# Patient Record
Sex: Female | Born: 1995 | Race: White | Hispanic: No | Marital: Single | Smoking: Never smoker
Health system: Southern US, Community
[De-identification: ages and names within clinical notes are randomized; demographics above are authoritative.]

## PROBLEM LIST (undated history)

## (undated) HISTORY — PX: TONSILLECTOMY AND ADENOIDECTOMY: SHX28

---
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
## 2014-11-27 IMAGING — CT CT ABD-PELV W/ CM
2 of 4 series · 17 of 46 positions shown, 19 images · IV contrast (OMNIPAQUE)
Comparison: CT abdomen pelvis 03/24/2007

CLINICAL DATA: r/o appendicitis

EXAM:
CT ABDOMEN AND PELVIS WITH CONTRAST
TECHNIQUE: Multidetector CT imaging of the abdomen and pelvis was performed
using the standard protocol following bolus administration of
intravenous contrast.
CONTRAST:  50mL OMNIPAQUE IOHEXOL 300 MG/ML SOLN, 100mL OMNIPAQUE
IOHEXOL 300 MG/ML SOLN

[Series 2: rtn a/p with · axial · 0.74mm/px · z∈[-460,-54]mm · 14 of 89 slices shown, 16 images]
[im 4/89  soft-tissue]
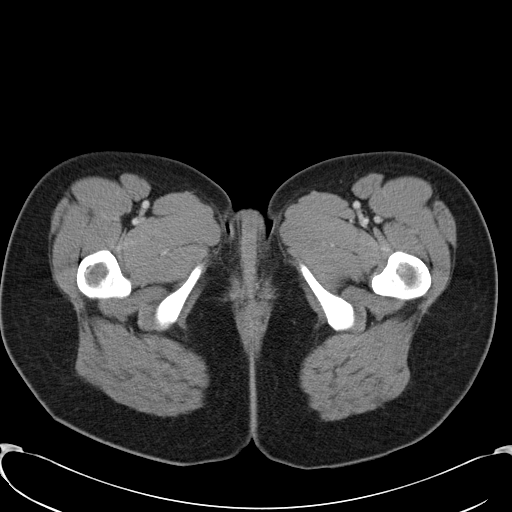
[im 4/89  bone]
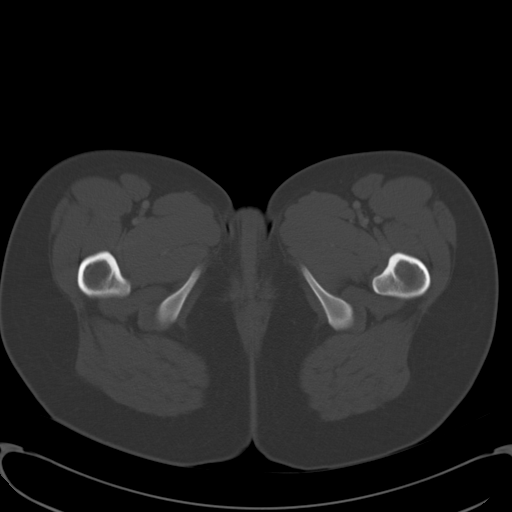
[im 12/89  soft-tissue]
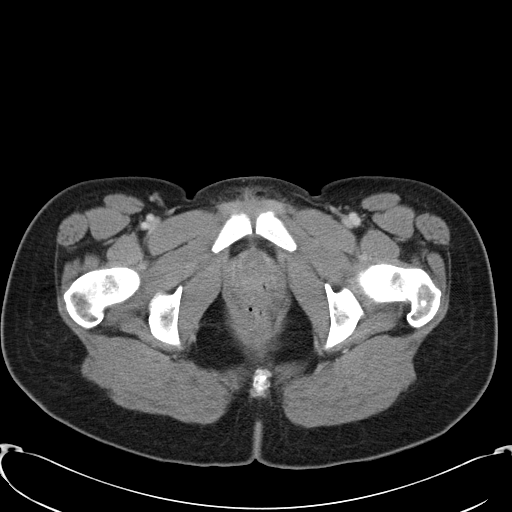
[im 16/89  soft-tissue]
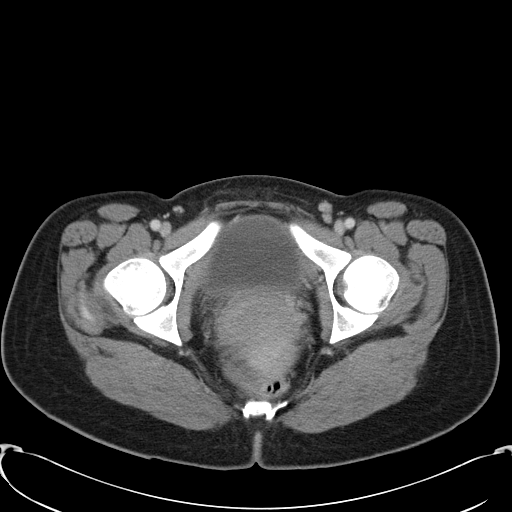
[im 23/89  soft-tissue]
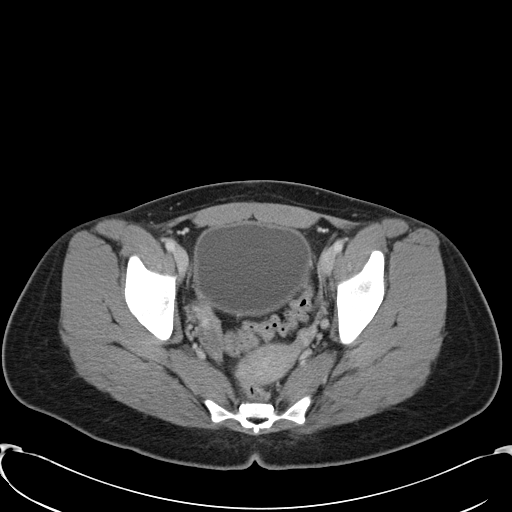
[im 31/89  soft-tissue]
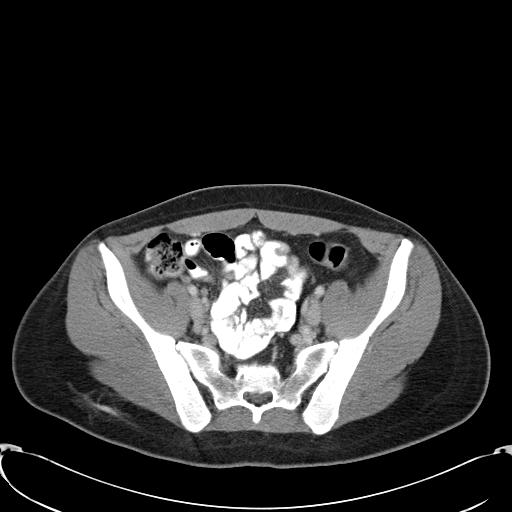
[im 35/89  soft-tissue]
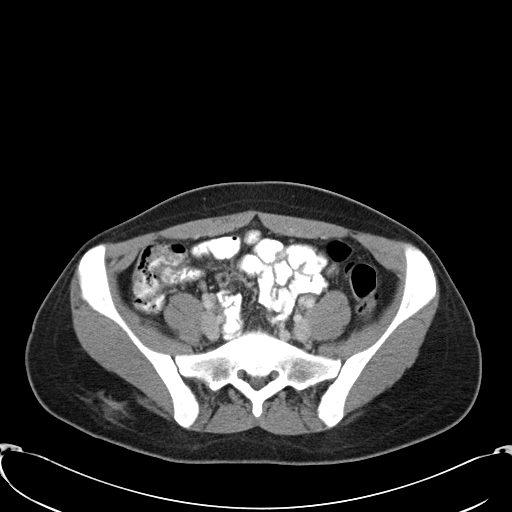
[im 43/89  soft-tissue]
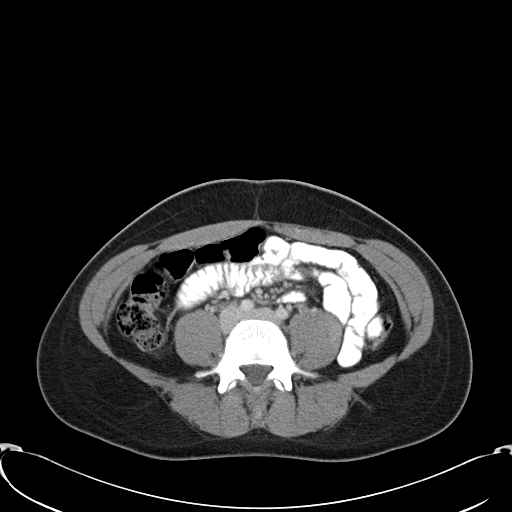
[im 46/89  soft-tissue]
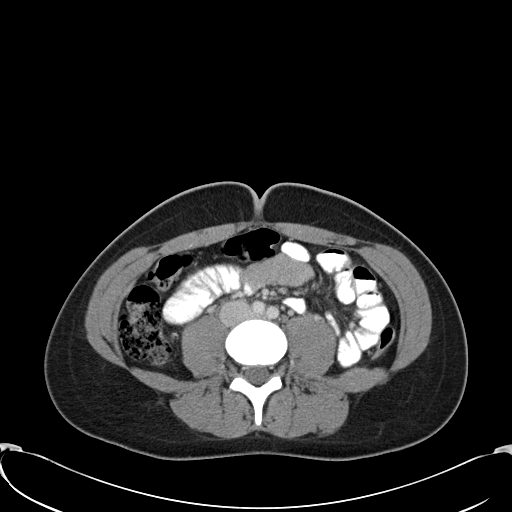
[im 54/89  soft-tissue]
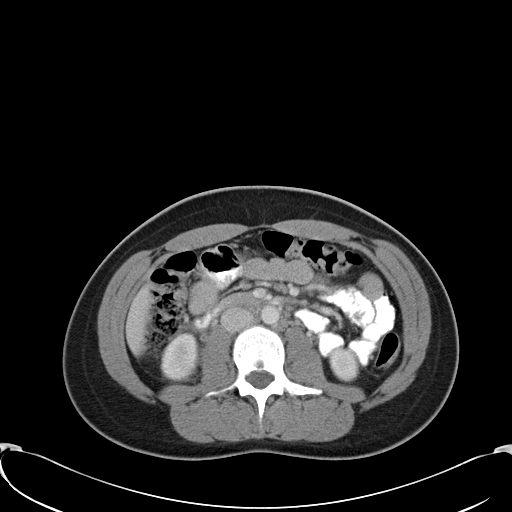
[im 54/89  bone]
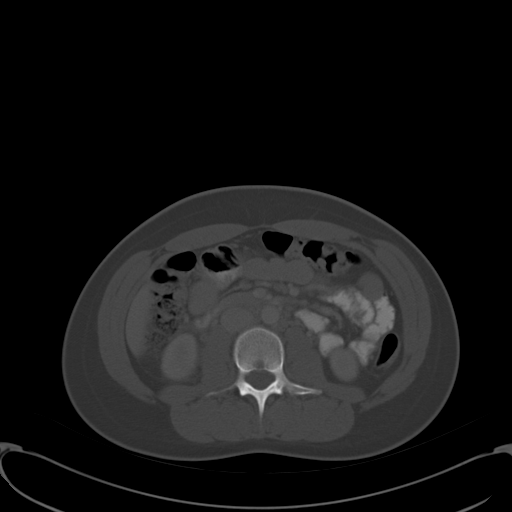
[im 58/89  soft-tissue]
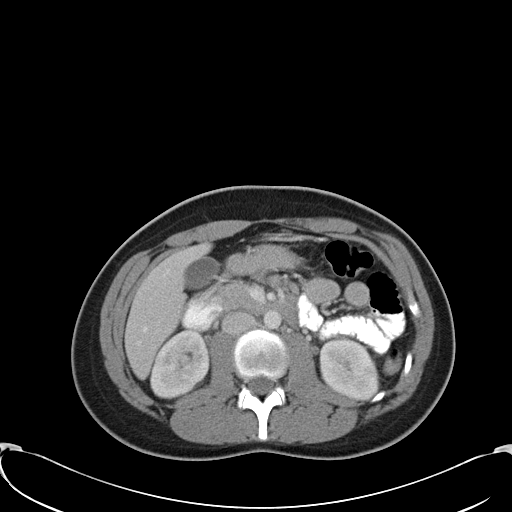
[im 66/89  soft-tissue]
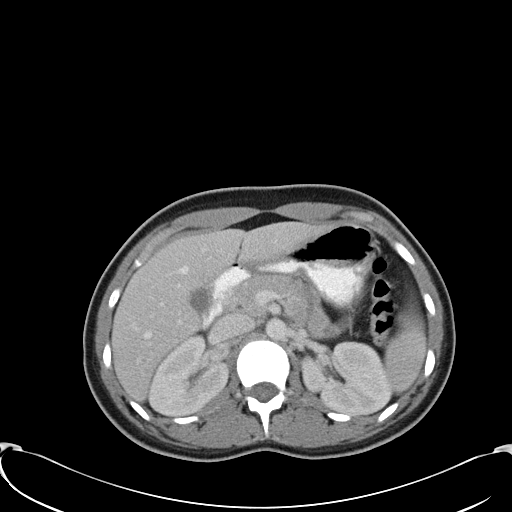
[im 73/89  soft-tissue]
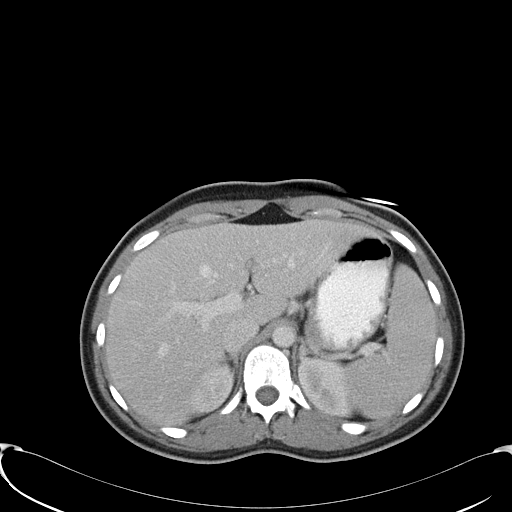
[im 77/89  soft-tissue]
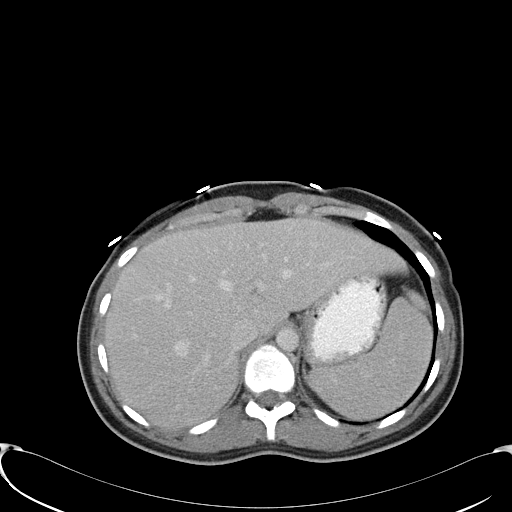
[im 85/89  soft-tissue]
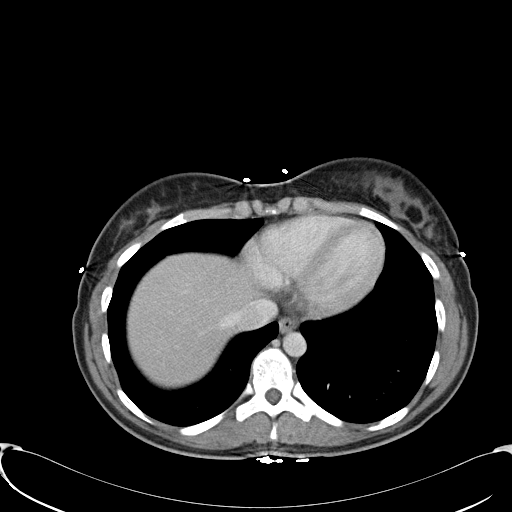

[Series 602: <mpr thick range> · coronal · 0.87mm/px · 3 of 94 slices shown]
[im 32/94  soft-tissue]
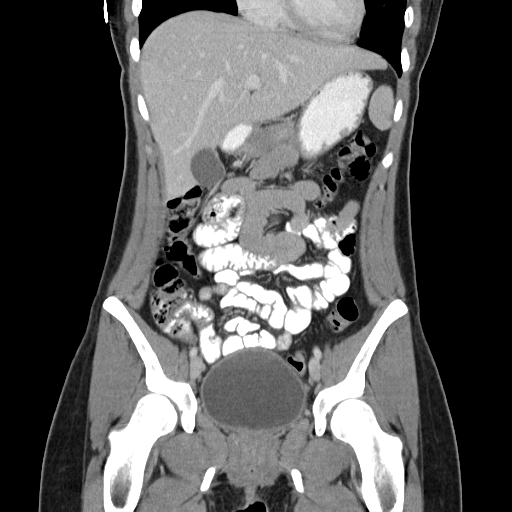
[im 42/94  soft-tissue]
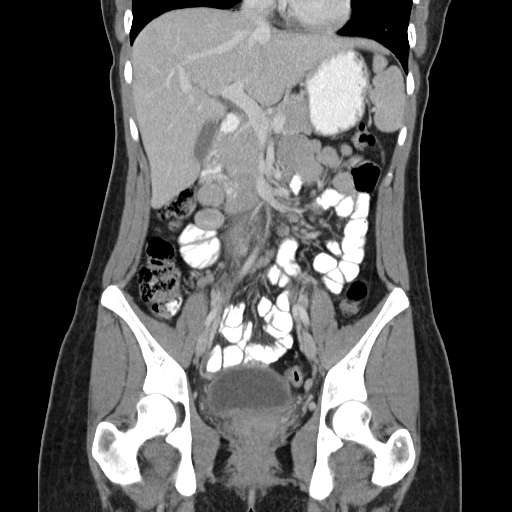
[im 52/94  soft-tissue]
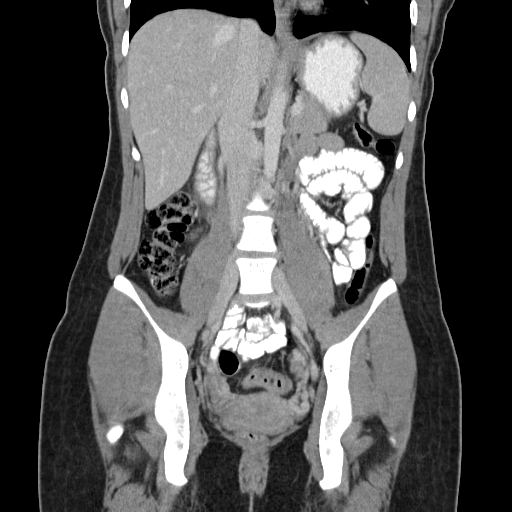

[17 of 46 positions shown; findings below may reference images not displayed]

FINDINGS: The lung bases are negative.

The liver, spleen, adrenals, pancreas common kidneys are
unremarkable.

Small to moderate amount of stool within the ascending colon. The
bowel is otherwise negative. The appendix identified and negative.

No abdominal aortic aneurysm. The celiac, SMA, IMA, portal vein, SMV
are opacified.

No abdominal nor pelvic masses, free fluid, adenopathy, nor
loculated fluid collections.

No abdominal wall nor inguinal hernia.

The no aggressive appearing osseous lesions.
IMPRESSION: No CT evidence of obstructive or inflammatory abnormalities. The
appendix is identified and unremarkable. No CT evidence accounting
for the patient's clinical presentation.
